# Patient Record
Sex: Female | Born: 1990 | Race: Black or African American | Hispanic: No | Marital: Single | Smoking: Never smoker
Health system: Southern US, Community
[De-identification: ages and names within clinical notes are randomized; demographics above are authoritative.]

## PROBLEM LIST (undated history)

## (undated) DIAGNOSIS — R519 Headache, unspecified: Secondary | ICD-10-CM

## (undated) DIAGNOSIS — R51 Headache: Secondary | ICD-10-CM

## (undated) HISTORY — PX: OTHER SURGICAL HISTORY: SHX169

## (undated) HISTORY — DX: Headache, unspecified: R51.9

## (undated) HISTORY — DX: Headache: R51

---
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
## 2013-01-10 ENCOUNTER — Emergency Department (HOSPITAL_COMMUNITY)
Admission: EM | Admit: 2013-01-10 | Discharge: 2013-01-10 | Disposition: A | Discharge status: Home / Self Care | Payer: 59 | Attending: Emergency Medicine | Admitting: Emergency Medicine

## 2013-01-10 DIAGNOSIS — R197 Diarrhea, unspecified: Secondary | ICD-10-CM | POA: Insufficient documentation

## 2013-01-10 DIAGNOSIS — Z3202 Encounter for pregnancy test, result negative: Secondary | ICD-10-CM | POA: Insufficient documentation

## 2013-01-10 DIAGNOSIS — R1110 Vomiting, unspecified: Secondary | ICD-10-CM | POA: Insufficient documentation

## 2013-01-10 LAB — URINALYSIS, ROUTINE W REFLEX MICROSCOPIC
Glucose, UA: NEGATIVE mg/dL
Hgb urine dipstick: NEGATIVE
Ketones, ur: 40 mg/dL — AB
Protein, ur: NEGATIVE mg/dL
Specific Gravity, Urine: 1.014 (ref 1.005–1.030)
Urobilinogen, UA: 1 mg/dL (ref 0.0–1.0)
pH: 7.5 (ref 5.0–8.0)

## 2013-01-10 LAB — URINE MICROSCOPIC-ADD ON

## 2013-01-10 LAB — BASIC METABOLIC PANEL
BUN: 9 mg/dL (ref 6–23)
Chloride: 104 mEq/L (ref 96–112)
GFR calc non Af Amer: >90 mL/min (ref >90)
Glucose, Bld: 84 mg/dL (ref 70–99)
Sodium: 140 mEq/L (ref 135–145)

## 2013-01-10 LAB — PREGNANCY, URINE: Preg Test, Ur: NEGATIVE

## 2013-01-10 MED ORDER — SODIUM CHLORIDE 0.9 % IV BOLUS (SEPSIS)
1000.0000 mL | Freq: Once | INTRAVENOUS | Status: AC
  Administered 2013-01-10: 1000 mL via INTRAVENOUS

## 2013-01-10 MED ORDER — ONDANSETRON HCL 4 MG/2ML IJ SOLN
4.0000 mg | Freq: Once | INTRAMUSCULAR | Status: AC
  Administered 2013-01-10: 4 mg via INTRAVENOUS
  Filled 2013-01-10: qty 2

## 2013-01-10 MED ORDER — ONDANSETRON 4 MG PO TBDP: 4.0000 mg | ORAL_TABLET | Freq: Three times a day (TID) | ORAL | Status: DC | PRN

## 2013-01-10 NOTE — ED Provider Notes (Signed)
History     CSN: [id number]  Arrival date & time 01/10/13  1159   First MD Initiated Contact with Patient 01/10/13 1220      Chief Complaint  Patient presents with  . Emesis    (Consider location/radiation/quality/duration/timing/severity/associated sxs/prior treatment) Patient is a 22 y.o. female presenting with vomiting. The history is provided by the patient.  Emesis Severity:  Moderate Duration:  1 day Timing:  Intermittent Quality:  Unable to specify Able to tolerate:  Liquids Progression:  Unchanged Chronicity:  New Recent urination:  Normal Relieved by:  Nothing Worsened by:  Nothing tried Ineffective treatments:  None tried Associated symptoms: diarrhea   Associated symptoms: no abdominal pain and no fever   Risk factors: sick contacts     No past medical history on file.  No past surgical history on file.  No family history on file.  History  Substance Use Topics  . Smoking status: Not on file  . Smokeless tobacco: Not on file  . Alcohol Use: Not on file    OB History   No data available      Review of Systems  Constitutional: Negative.   Respiratory: Negative.   Cardiovascular: Negative.   Gastrointestinal: Positive for vomiting and diarrhea. Negative for abdominal pain.    Allergies  Review of patient's allergies indicates no known allergies.  Home Medications  No current outpatient prescriptions on file.  BP 117/64  Pulse 86  Temp(Src) 98.4 F (36.9 C) (Oral)  Resp 14  Ht 5\' 2"  (1.575 m)  Wt 107 lb (48.535 kg)  BMI 19.57 kg/m2  SpO2 99%  LMP 12/30/2012  Physical Exam  Nursing note and vitals reviewed. Constitutional: She is oriented to person, place, and time. She appears well-developed and well-nourished.  HENT:  Head: Normocephalic and atraumatic.  Cardiovascular: Normal rate and regular rhythm.   Pulmonary/Chest: Effort normal and breath sounds normal.  Abdominal: Soft. Bowel sounds are normal. There is no tenderness.   Musculoskeletal: Normal range of motion.  Neurological: She is alert and oriented to person, place, and time.  Skin: Skin is warm and dry.  Psychiatric: She has a normal mood and affect.    ED Course  Procedures (including critical care time)  Labs Reviewed  URINALYSIS, ROUTINE W REFLEX MICROSCOPIC - Abnormal; Notable for the following:    APPearance CLOUDY (*)    Ketones, ur 40 (*)    Leukocytes, UA TRACE (*)    All other components within normal limits  URINE MICROSCOPIC-ADD ON - Abnormal; Notable for the following:    Squamous Epithelial / LPF MANY (*)    Bacteria, UA FEW (*)    All other components within normal limits  URINE CULTURE  PREGNANCY, URINE  BASIC METABOLIC PANEL   No results found.   1. Vomiting       MDM  Pt is tolerating po without any problem:likely viral:urine sent for culture:doubt uti        Teressa Lower, NP 01/10/13 1450

## 2013-01-10 NOTE — ED Notes (Signed)
She c/o n/v/d since yesterday.  She states she has had ~6 episodes of emesis and 2 diarrhea stools since yesterday, and she c/o persistent nausea. She denies fever/cough; and is in no distress.

## 2013-01-10 NOTE — ED Provider Notes (Signed)
Medical screening examination/treatment/procedure(s) were performed by non-physician practitioner and as supervising physician I was immediately available for consultation/collaboration.   Richardean Canal, MD 01/10/13 [phone]

## 2013-01-12 LAB — URINE CULTURE: Colony Count: >=100000

## 2013-01-13 NOTE — ED Notes (Signed)
+   Urine Chart sent to EDP office for review. 

## 2013-01-15 NOTE — ED Notes (Addendum)
Chart returned from EDP office - No treatment. Per Paula Welch

## 2013-07-07 ENCOUNTER — Ambulatory Visit (INDEPENDENT_AMBULATORY_CARE_PROVIDER_SITE_OTHER): Payer: 59 | Admitting: Physician Assistant

## 2013-07-07 VITALS — BP 112/60 | HR 91 | Temp 98.2°F | Resp 18 | Ht 61.0 in | Wt 106.0 lb

## 2013-07-07 DIAGNOSIS — R319 Hematuria, unspecified: Secondary | ICD-10-CM

## 2013-07-07 DIAGNOSIS — R350 Frequency of micturition: Secondary | ICD-10-CM

## 2013-07-07 DIAGNOSIS — J069 Acute upper respiratory infection, unspecified: Secondary | ICD-10-CM

## 2013-07-07 LAB — POCT URINALYSIS DIPSTICK
Bilirubin, UA: NEGATIVE
Glucose, UA: NEGATIVE
Ketones, UA: 15
Nitrite, UA: NEGATIVE
Spec Grav, UA: >=1.03
Urobilinogen, UA: 2
pH, UA: 6.5

## 2013-07-07 LAB — POCT UA - MICROSCOPIC ONLY
Casts, Ur, LPF, POC: NEGATIVE
Crystals, Ur, HPF, POC: NEGATIVE
Yeast, UA: NEGATIVE

## 2013-07-07 MED ORDER — CIPROFLOXACIN 500 MG/5ML (10%) PO SUSR: 500.0000 mg | Freq: Two times a day (BID) | ORAL | Status: DC

## 2013-07-07 NOTE — Progress Notes (Signed)
Subjective:    Patient ID: Paula Welch, female    DOB: 05/04/1991, 22 y.o.   MRN: [id number]  HPI 22 year old female presents with 4 day history of hematuria.  States symptoms started after she ended her menses and have persisted. She has noticed a small amount of blood when she wipes after urination.  Admits to some urinary frequency but no dysuria, vaginal discharge, odor, nausea, vomiting, abdominal pain, fever, or chills.  No hx of UTI's in the past.  Also complains of 2 days of cough and nasal congestion. Has been taking Mucinex and alka-seltzer which does seem to be helping some.  No sore throat, otalgia, headache, sinus pain, SOB, wheezing, or hemoptysis.  She is healthy with no known medical problems.  Patient is otherwise doing well with no other concerns today.     Review of Systems  Constitutional: Negative for fever and chills.  HENT: Positive for congestion, rhinorrhea and postnasal drip. Negative for ear pain, sore throat and sinus pressure.   Gastrointestinal: Negative for nausea, vomiting and abdominal pain.  Genitourinary: Positive for frequency and hematuria. Negative for dysuria, vaginal discharge, menstrual problem and pelvic pain.  Neurological: Negative for dizziness and headaches.       Objective:   Physical Exam  Constitutional: She is oriented to person, place, and time. She appears well-developed and well-nourished.  HENT:  Head: Normocephalic and atraumatic.  Right Ear: Hearing, tympanic membrane, external ear and ear canal normal.  Left Ear: Hearing, tympanic membrane, external ear and ear canal normal.  Mouth/Throat: Uvula is midline, oropharynx is clear and moist and mucous membranes are normal.  Eyes: Conjunctivae are normal.  Neck: Normal range of motion. Neck supple.  Cardiovascular: Normal rate, regular rhythm and normal heart sounds.   Pulmonary/Chest: Effort normal and breath sounds normal.  Abdominal: Soft. Bowel sounds are normal. There is no  tenderness. There is no rebound, no guarding and no CVA tenderness.  Lymphadenopathy:    She has no cervical adenopathy.  Neurological: She is alert and oriented to person, place, and time.  Psychiatric: She has a normal mood and affect. Her behavior is normal. Judgment and thought content normal.    Results for orders placed in visit on 07/07/13  POCT URINALYSIS DIPSTICK      Result Value Range   Color, UA yellow     Clarity, UA clear     Glucose, UA neg     Bilirubin, UA neg     Ketones, UA 15     Spec Grav, UA >=1.030     Blood, UA trace-intact     pH, UA 6.5     Protein, UA trace     Urobilinogen, UA 2.0     Nitrite, UA neg     Leukocytes, UA small (1+)    POCT UA - MICROSCOPIC ONLY      Result Value Range   WBC, Ur, HPF, POC 4-16     RBC, urine, microscopic 3-12     Bacteria, U Microscopic trace     Mucus, UA mod     Epithelial cells, urine per micros 0-3     Crystals, Ur, HPF, POC neg     Casts, Ur, LPF, POC neg     Yeast, UA neg           Assessment & Plan:  Blood in the urine - Plan: POCT urinalysis dipstick, POCT UA - Microscopic Only, ciprofloxacin (CIPRO) 500 MG/5ML (10%) suspension, Urine culture  Urinary frequency  URI (upper respiratory infection)  Urine culture sent Start Cipro 500 mg bid x 5 days Increase fluids and rest URI - likely viral. Continue mucinex and alka-seltzer x 2-3 days. If no improvement may consider antibiotic.  Follow up if symptoms worsen or fail to improve.

## 2013-07-09 LAB — URINE CULTURE
Colony Count: NO GROWTH
Organism ID, Bacteria: NO GROWTH

## 2016-06-27 ENCOUNTER — Ambulatory Visit (INDEPENDENT_AMBULATORY_CARE_PROVIDER_SITE_OTHER): Payer: 59 | Admitting: Neurology

## 2016-06-27 ENCOUNTER — Encounter: Payer: Self-pay | Admitting: Neurology

## 2016-06-27 VITALS — BP 110/68 | HR 62 | Resp 20 | Ht 62.0 in | Wt 122.0 lb

## 2016-06-27 DIAGNOSIS — R519 Headache, unspecified: Secondary | ICD-10-CM

## 2016-06-27 DIAGNOSIS — R110 Nausea: Secondary | ICD-10-CM | POA: Diagnosis not present

## 2016-06-27 DIAGNOSIS — G43709 Chronic migraine without aura, not intractable, without status migrainosus: Secondary | ICD-10-CM | POA: Diagnosis not present

## 2016-06-27 DIAGNOSIS — Z8249 Family history of ischemic heart disease and other diseases of the circulatory system: Secondary | ICD-10-CM | POA: Diagnosis not present

## 2016-06-27 DIAGNOSIS — R51 Headache: Secondary | ICD-10-CM

## 2016-06-27 DIAGNOSIS — H93A9 Pulsatile tinnitus, unspecified ear: Secondary | ICD-10-CM | POA: Diagnosis not present

## 2016-06-27 MED ORDER — SUMATRIPTAN SUCCINATE 100 MG PO TABS: 100.0000 mg | ORAL_TABLET | Freq: Once | ORAL | 12 refills | Status: DC | PRN

## 2016-06-27 MED ORDER — NORTRIPTYLINE HCL 10 MG PO CAPS: 10.0000 mg | ORAL_CAPSULE | Freq: Every day | ORAL | 12 refills | Status: DC

## 2016-06-27 MED ORDER — ONDANSETRON 4 MG PO TBDP: 4.0000 mg | ORAL_TABLET | Freq: Three times a day (TID) | ORAL | 12 refills | Status: DC | PRN

## 2016-06-27 MED ORDER — TOPIRAMATE 25 MG PO TABS: 50.0000 mg | ORAL_TABLET | Freq: Every day | ORAL | 12 refills | Status: DC

## 2016-06-27 NOTE — Progress Notes (Signed)
[id number]    Provider:  Dr Lucia GaskinsAhern Referring Provider: Jonna ClarkKrishnamani Chinnasamy, MD Primary Care Physician:  Jonna ClarkKrishnamani Chinnasamy, MD  CC:  headaches  HPI:  [name] is a 25 y.o. female here as a referral from Dr. Yetta BarreJones for headaches. Patient is here with mother who provides most information. There is a family history of migraines in grandmother and aunt. Throbbing on the right side, going into a fark room helps, smells bother her, nausea and vomiting but not often. She is having them every other day. They can last a few hours. She has to go to sleep to resolve them. No known triggers. No inciting events or head trauma.  History aneurysms in mother, 2 aunts and grandmother and paternal grandmother. No vision changes or hearing changes. She takes excedrin migraine, tylenol, goody powders. Starts light and gradually gets worse can be severe. No aura. Always on the right and can be 8/10. Ringing in the ears, pulsating ringing in the ears. No other focal neurologic deficits or complaints  Reviewed notes, labs and imaging from outside physicians, which showed:  CT 5/20017 HEAD CT WITHOUT CONTRAST:  Technique: Contiguous axial images were obtained from the base of the skull through the vertex according to standard protocol without contrast.  Comparison: None.   Findings: No evidence of acute infarct, hemorrhage, mass, mass effect, hydrocephalus, or fracture. Visualized paranasal sinuses and mastoid air cells are clear.   IMPRESSION:  No acute findings.  BMP normal with creatinine 0.6  Review of Systems: Patient complains of symptoms per HPI as well as the following symptoms: no CP, no SOB. Pertinent negatives per HPI. All others negative.   Social History   Social History  . Marital status: Single    Spouse name: N/A  . Number of children: N/A  . Years of education: N/A   Occupational History  . student    Social History Main Topics  . Smoking status:  Never Smoker  . Smokeless tobacco: Never Used  . Alcohol use 1.8 oz/week    3 Glasses of wine per week  . Drug use: No  . Sexual activity: Not on file   Other Topics Concern  . Not on file   Social History Narrative   Drinks rare caffeine.    Family History  Problem Relation Age of Onset  . Aneurysm Mother   . Breast cancer Mother     Past Medical History:  Diagnosis Date  . Headache     Past Surgical History:  Procedure Laterality Date  . no surgical history      Current Outpatient Prescriptions  Medication Sig Dispense Refill  . acetaminophen (TYLENOL) 500 MG tablet Take 500 mg by mouth every 6 (six) hours as needed.    Marland Kitchen. aspirin-acetaminophen-caffeine (EXCEDRIN MIGRAINE) 250-250-65 MG tablet Take by mouth every 6 (six) hours as needed for headache.    . ondansetron (ZOFRAN ODT) 4 MG disintegrating tablet Take 1 tablet (4 mg total) by mouth every 8 (eight) hours as needed for nausea or vomiting. 20 tablet 12  . SUMAtriptan (IMITREX) 100 MG tablet Take 1 tablet (100 mg total) by mouth once as needed for migraine. May repeat in 2 hours if headache persists or recurs. 10 tablet 12  . topiramate (TOPAMAX) 25 MG tablet Take 2 tablets (50 mg total) by mouth at bedtime. 60 tablet 12   No current facility-administered medications for this visit.     Allergies as of 06/27/2016  . (No Known Allergies)  Vitals: BP 110/68   Pulse 62   Resp 20   Ht 5\' 2"  (1.575 m)   Wt 122 lb (55.3 kg)   BMI 22.31 kg/m  Last Weight:  Wt Readings from Last 1 Encounters:  06/27/16 122 lb (55.3 kg)   Last Height:   Ht Readings from Last 1 Encounters:  06/27/16 5\' 2"  (1.575 m)    Physical exam: Exam: Gen: NAD, conversant, well nourised, well groomed                     CV: RRR, no MRG. No Carotid Bruits. No peripheral edema, warm, nontender Eyes: Conjunctivae clear without exudates or hemorrhage  Neuro: Detailed Neurologic Exam  Speech:    Speech is normal; fluent and  spontaneous with normal comprehension.  Cognition:    The patient is oriented to person, place, and time;     recent and remote memory intact;     language fluent;     normal attention, concentration,     fund of knowledge Cranial Nerves:    The pupils are equal, round, and reactive to light. The fundi are normal and spontaneous venous pulsations are present. Visual fields are full to finger confrontation. Extraocular movements are intact. Trigeminal sensation is intact and the muscles of mastication are normal. The face is symmetric. The palate elevates in the midline. Hearing intact. Voice is normal. Shoulder shrug is normal. The tongue has normal motion without fasciculations.   Coordination:    Normal finger to nose and heel to shin. Normal rapid alternating movements.   Gait:    Heel-toe and tandem gait are normal.   Motor Observation:    No asymmetry, no atrophy, and no involuntary movements noted. Tone:    Normal muscle tone.    Posture:    Posture is normal. normal erect    Strength:    Strength is V/V in the upper and lower limbs.      Sensation: intact to LT     Reflex Exam:  DTR's:    Deep tendon reflexes in the upper and lower extremities are normal bilaterally.   Toes:    The toes are downgoing bilaterally.   Clonus:    Clonus is absent.      Assessment/Plan:  25 year old with chronic migraines  Fhx of aneurysms and pulsatile tinnitus: MRA of the head  Remember to drink plenty of fluid, eat healthy meals and do not skip any meals. Try to eat protein with a every meal and eat a healthy snack such as fruit or nuts in between meals. Try to keep a regular sleep-wake schedule and try to exercise daily, particularly in the form of walking, 20-30 minutes a day, if you can.   As far as your medications are concerned, I would like to suggest Topiramate: start with 25mg  at night for one week then increase to 50mg  at bedtime Imitrex: Please take one tablet at the  onset of your headache. If it does not improve the symptoms please take one additional tablet in 2 hours. Do not take more then 2 tablets in 24hrs. Do not take use more then 2 to 3 days in a week. Zofran as needed for nausea  As far as diagnostic testing: MRA of the head, labs  I would like to see you back in 3-4 month, sooner if we need to. Please call us with any interim questions, concerns, problems, updates or refill requests.   Discussed side effects as per  patient instructions, do not get pregnant whole on topamax - teratogenic   Discussed: To prevent or relieve headaches, try the following: Cool Compress. Lie down and place a cool compress on your head.  Avoid headache triggers. If certain foods or odors seem to have triggered your migraines in the past, avoid them. A headache diary might help you identify triggers.  Include physical activity in your daily routine. Try a daily walk or other moderate aerobic exercise.  Manage stress. Find healthy ways to cope with the stressors, such as delegating tasks on your to-do list.  Practice relaxation techniques. Try deep breathing, yoga, massage and visualization.  Eat regularly. Eating regularly scheduled meals and maintaining a healthy diet might help prevent headaches. Also, drink plenty of fluids.  Follow a regular sleep schedule. Sleep deprivation might contribute to headaches Consider biofeedback. With this mind-body technique, you learn to control certain bodily functions - such as muscle tension, heart rate and blood pressure - to prevent headaches or reduce headache pain.    Proceed to emergency room if you experience new or worsening symptoms or symptoms do not resolve, if you have new neurologic symptoms or if headache is severe, or for any concerning symptom.   Cc: Jonna Clark, MD   Naomie Dean, MD  [facility] [address] [place], [place] [address]  Phone [phone] Fax  [phone]

## 2016-06-27 NOTE — Patient Instructions (Addendum)
Remember to drink plenty of fluid, eat healthy meals and do not skip any meals. Try to eat protein with a every meal and eat a healthy snack such as fruit or nuts in between meals. Try to keep a regular sleep-wake schedule and try to exercise daily, particularly in the form of walking, 20-30 minutes a day, if you can.   As far as your medications are concerned, I would like to suggest Topiramate: start with 25mg  at night for one week then increase to 50mg  at bedtime Imitrex: Please take one tablet at the onset of your headache. If it does not improve the symptoms please take one additional tablet in 2 hours. Do not take more then 2 tablets in 24hrs. Do not take use more then 2 to 3 days in a week. Zofran as needed for nausea  As far as diagnostic testing: MRA of the head, labs  I would like to see you back in 3-4 month, sooner if we need to. Please call us with any interim questions, concerns, problems, updates or refill requests.    Our phone number is [phone]. We also have an after hours call service for urgent matters and there is a physician on-call for urgent questions. For any emergencies you know to call 911 or go to the nearest emergency room  Topiramate tablets What is this medicine? TOPIRAMATE (toe PYRE a mate) is used to treat seizures in adults or children with epilepsy. It is also used for the prevention of migraine headaches. This medicine may be used for other purposes; ask your health care provider or pharmacist if you have questions. What should I tell my health care provider before I take this medicine? They need to know if you have any of these conditions: -bleeding disorders -cirrhosis of the liver or liver disease -diarrhea -glaucoma -kidney stones or kidney disease -low blood counts, like low white cell, platelet, or red cell counts -lung disease like asthma, obstructive pulmonary disease, emphysema -metabolic acidosis -on a ketogenic diet -schedule for surgery  or a procedure -suicidal thoughts, plans, or attempt; a previous suicide attempt by you or a family member -an unusual or allergic reaction to topiramate, other medicines, foods, dyes, or preservatives -pregnant or trying to get pregnant -breast-feeding How should I use this medicine? Take this medicine by mouth with a glass of water. Follow the directions on the prescription label. Do not crush or chew. You may take this medicine with meals. Take your medicine at regular intervals. Do not take it more often than directed. Talk to your pediatrician regarding the use of this medicine in children. Special care may be needed. While this drug may be prescribed for children as young as 552 years of age for selected conditions, precautions do apply. Overdosage: If you think you have taken too much of this medicine contact a poison control center or emergency room at once. NOTE: This medicine is only for you. Do not share this medicine with others. What if I miss a dose? If you miss a dose, take it as soon as you can. If your next dose is to be taken in less than 6 hours, then do not take the missed dose. Take the next dose at your regular time. Do not take double or extra doses. What may interact with this medicine? Do not take this medicine with any of the following medications: -probenecid This medicine may also interact with the following medications: -acetazolamide -alcohol -amitriptyline -aspirin and aspirin-like medicines -birth control pills -certain  medicines for depression -certain medicines for seizures -certain medicines that treat or prevent blood clots like warfarin, enoxaparin, dalteparin, apixaban, dabigatran, and rivaroxaban -digoxin -hydrochlorothiazide -lithium -medicines for pain, sleep, or muscle relaxation -metformin -methazolamide -NSAIDS, medicines for pain and inflammation, like ibuprofen or naproxen -pioglitazone -risperidone This list may not describe all possible  interactions. Give your health care provider a list of all the medicines, herbs, non-prescription drugs, or dietary supplements you use. Also tell them if you smoke, drink alcohol, or use illegal drugs. Some items may interact with your medicine. What should I watch for while using this medicine? Visit your doctor or health care professional for regular checks on your progress. Do not stop taking this medicine suddenly. This increases the risk of seizures if you are using this medicine to control epilepsy. Wear a medical identification bracelet or chain to say you have epilepsy or seizures, and carry a card that lists all your medicines. This medicine can decrease sweating and increase your body temperature. Watch for signs of deceased sweating or fever, especially in children. Avoid extreme heat, hot baths, and saunas. Be careful about exercising, especially in hot weather. Contact your health care provider right away if you notice a fever or decrease in sweating. You should drink plenty of fluids while taking this medicine. If you have had kidney stones in the past, this will help to reduce your chances of forming kidney stones. If you have stomach pain, with nausea or vomiting and yellowing of your eyes or skin, call your doctor immediately. You may get drowsy, dizzy, or have blurred vision. Do not drive, use machinery, or do anything that needs mental alertness until you know how this medicine affects you. To reduce dizziness, do not sit or stand up quickly, especially if you are an older patient. Alcohol can increase drowsiness and dizziness. Avoid alcoholic drinks. If you notice blurred vision, eye pain, or other eye problems, seek medical attention at once for an eye exam. The use of this medicine may increase the chance of suicidal thoughts or actions. Pay special attention to how you are responding while on this medicine. Any worsening of mood, or thoughts of suicide or dying should be reported to  your health care professional right away. This medicine may increase the chance of developing metabolic acidosis. If left untreated, this can cause kidney stones, bone disease, or slowed growth in children. Symptoms include breathing fast, fatigue, loss of appetite, irregular heartbeat, or loss of consciousness. Call your doctor immediately if you experience any of these side effects. Also, tell your doctor about any surgery you plan on having while taking this medicine since this may increase your risk for metabolic acidosis. Birth control pills may not work properly while you are taking this medicine. Talk to your doctor about using an extra method of birth control. Women who become pregnant while using this medicine may enroll in the [place] [employer] Antiepileptic Drug Pregnancy Registry by calling [phone]. This registry collects information about the safety of antiepileptic drug use during pregnancy. What side effects may I notice from receiving this medicine? Side effects that you should report to your doctor or health care professional as soon as possible: -allergic reactions like skin rash, itching or hives, swelling of the face, lips, or tongue -decreased sweating and/or rise in body temperature -depression -difficulty breathing, fast or irregular breathing patterns -difficulty speaking -difficulty walking or controlling muscle movements -hearing impairment -redness, blistering, peeling or loosening of the skin, including inside the mouth -tingling, pain  or numbness in the hands or feet -unusual bleeding or bruising -unusually weak or tired -worsening of mood, thoughts or actions of suicide or dying Side effects that usually do not require medical attention (report to your doctor or health care professional if they continue or are bothersome): -altered taste -back pain, joint or muscle aches and pains -diarrhea, or constipation -headache -loss of appetite -nausea -stomach upset,  indigestion -tremors This list may not describe all possible side effects. Call your doctor for medical advice about side effects. You may report side effects to FDA at 1-800-FDA-1088. Where should I keep my medicine? Keep out of the reach of children. Store at room temperature between 15 and 30 degrees C (59 and 86 degrees F) in a tightly closed container. Protect from moisture. Throw away any unused medicine after the expiration date. NOTE: This sheet is a summary. It may not cover all possible information. If you have questions about this medicine, talk to your doctor, pharmacist, or health care provider.    2016, [employer]. (2013-11-02 23:17:57)  Sumatriptan tablets What is this medicine? SUMATRIPTAN (soo ma TRIP tan) is used to treat migraines with or without aura. An aura is a strange feeling or visual disturbance that warns you of an attack. It is not used to prevent migraines. This medicine may be used for other purposes; ask your health care provider or pharmacist if you have questions. What should I tell my health care provider before I take this medicine? They need to know if you have any of these conditions: -circulation problems in fingers and toes -diabetes -heart disease -high blood pressure -high cholesterol -history of irregular heartbeat -history of stroke -kidney disease -liver disease -postmenopausal or surgical removal of uterus and ovaries -seizures -smoke tobacco -stomach or intestine problems -an unusual or allergic reaction to sumatriptan, other medicines, foods, dyes, or preservatives -pregnant or trying to get pregnant -breast-feeding How should I use this medicine? Take this medicine by mouth with a glass of water. Follow the directions on the prescription label. This medicine is taken at the first symptoms of a migraine. It is not for everyday use. If your migraine headache returns after one dose, you can take another dose as directed. You  must leave at least 2 hours between doses, and do not take more than 100 mg as a single dose. Do not take more than 200 mg total in any 24 hour period. If there is no improvement at all after the first dose, do not take a second dose without talking to your doctor or health care professional. Do not take your medicine more often than directed. Talk to your pediatrician regarding the use of this medicine in children. Special care may be needed. Overdosage: If you think you have taken too much of this medicine contact a poison control center or emergency room at once. NOTE: This medicine is only for you. Do not share this medicine with others. What if I miss a dose? This does not apply; this medicine is not for regular use. What may interact with this medicine? Do not take this medicine with any of the following medicines: -cocaine -ergot alkaloids like dihydroergotamine, ergonovine, ergotamine, methylergonovine -feverfew -MAOIs like Carbex, Eldepryl, Marplan, Nardil, and Parnate -other medicines for migraine headache like almotriptan, eletriptan, frovatriptan, naratriptan, rizatriptan, zolmitriptan -tryptophan This medicine may also interact with the following medications: -certain medicines for depression, anxiety, or psychotic disturbances This list may not describe all possible interactions. Give your health care provider a list  of all the medicines, herbs, non-prescription drugs, or dietary supplements you use. Also tell them if you smoke, drink alcohol, or use illegal drugs. Some items may interact with your medicine. What should I watch for while using this medicine? Only take this medicine for a migraine headache. Take it if you get warning symptoms or at the start of a migraine attack. It is not for regular use to prevent migraine attacks. You may get drowsy or dizzy. Do not drive, use machinery, or do anything that needs mental alertness until you know how this medicine affects you. To  reduce dizzy or fainting spells, do not sit or stand up quickly, especially if you are an older patient. Alcohol can increase drowsiness, dizziness and flushing. Avoid alcoholic drinks. Smoking cigarettes may increase the risk of heart-related side effects from using this medicine. If you take migraine medicines for 10 or more days a month, your migraines may get worse. Keep a diary of headache days and medicine use. Contact your healthcare professional if your migraine attacks occur more frequently. What side effects may I notice from receiving this medicine? Side effects that you should report to your doctor or health care professional as soon as possible: -allergic reactions like skin rash, itching or hives, swelling of the face, lips, or tongue -bloody or watery diarrhea -hallucination, loss of contact with reality -pain, tingling, numbness in the face, hands, or feet -seizures -signs and symptoms of a blood clot such as breathing problems; changes in vision; chest pain; severe, sudden headache; pain, swelling, warmth in the leg; trouble speaking; sudden numbness or weakness of the face, arm, or leg -signs and symptoms of a dangerous change in heartbeat or heart rhythm like chest pain; dizziness; fast or irregular heartbeat; palpitations, feeling faint or lightheaded; falls; breathing problems -signs and symptoms of a stroke like changes in vision; confusion; trouble speaking or understanding; severe headaches; sudden numbness or weakness of the face, arm, or leg; trouble walking; dizziness; loss of balance or coordination -stomach pain Side effects that usually do not require medical attention (report these to your doctor or health care professional if they continue or are bothersome): -changes in taste -facial flushing -headache -muscle cramps -muscle pain -nausea, vomiting -weak or tired This list may not describe all possible side effects. Call your doctor for medical advice about side  effects. You may report side effects to FDA at 1-800-FDA-1088. Where should I keep my medicine? Keep out of the reach of children. Store at room temperature between 2 and 30 degrees C (36 and 86 degrees F). Throw away any unused medicine after the expiration date. NOTE: This sheet is a summary. It may not cover all possible information. If you have questions about this medicine, talk to your doctor, pharmacist, or health care provider.    2016, [employer] Standard. (2015-05-05 17:46:40)  Ondansetron oral dissolving tablet What is this medicine? ONDANSETRON (on DAN se tron) is used to treat nausea and vomiting caused by chemotherapy. It is also used to prevent or treat nausea and vomiting after surgery. This medicine may be used for other purposes; ask your health care provider or pharmacist if you have questions. What should I tell my health care provider before I take this medicine? They need to know if you have any of these conditions: -heart disease -history of irregular heartbeat -liver disease -low levels of magnesium or potassium in the blood -an unusual or allergic reaction to ondansetron, granisetron, other medicines, foods, dyes, or preservatives -pregnant or trying  to get pregnant -breast-feeding How should I use this medicine? These tablets are made to dissolve in the mouth. Do not try to push the tablet through the foil backing. With dry hands, peel away the foil backing and gently remove the tablet. Place the tablet in the mouth and allow it to dissolve, then swallow. While you may take these tablets with water, it is not necessary to do so. Talk to your pediatrician regarding the use of this medicine in children. Special care may be needed. Overdosage: If you think you have taken too much of this medicine contact a poison control center or emergency room at once. NOTE: This medicine is only for you. Do not share this medicine with others. What if I miss a dose? If you  miss a dose, take it as soon as you can. If it is almost time for your next dose, take only that dose. Do not take double or extra doses. What may interact with this medicine? Do not take this medicine with any of the following medications: -apomorphine -certain medicines for fungal infections like fluconazole, itraconazole, ketoconazole, posaconazole, voriconazole -cisapride -dofetilide -dronedarone -pimozide -thioridazine -ziprasidone This medicine may also interact with the following medications: -carbamazepine -certain medicines for depression, anxiety, or psychotic disturbances -fentanyl -linezolid -MAOIs like Carbex, Eldepryl, Marplan, Nardil, and Parnate -methylene blue (injected into a vein) -other medicines that prolong the QT interval (cause an abnormal heart rhythm) -phenytoin -rifampicin -tramadol This list may not describe all possible interactions. Give your health care provider a list of all the medicines, herbs, non-prescription drugs, or dietary supplements you use. Also tell them if you smoke, drink alcohol, or use illegal drugs. Some items may interact with your medicine. What should I watch for while using this medicine? Check with your doctor or health care professional as soon as you can if you have any sign of an allergic reaction. What side effects may I notice from receiving this medicine? Side effects that you should report to your doctor or health care professional as soon as possible: -allergic reactions like skin rash, itching or hives, swelling of the face, lips, or tongue -breathing problems -confusion -dizziness -fast or irregular heartbeat -feeling faint or lightheaded, falls -fever and chills -loss of balance or coordination -seizures -sweating -swelling of the hands and feet -tightness in the chest -tremors -unusually weak or tired Side effects that usually do not require medical attention (report to your doctor or health care professional  if they continue or are bothersome): -constipation or diarrhea -headache This list may not describe all possible side effects. Call your doctor for medical advice about side effects. You may report side effects to FDA at 1-800-FDA-1088. Where should I keep my medicine? Keep out of the reach of children. Store between 2 and 30 degrees C (36 and 86 degrees F). Throw away any unused medicine after the expiration date. NOTE: This sheet is a summary. It may not cover all possible information. If you have questions about this medicine, talk to your doctor, pharmacist, or health care provider.    2016, [employer]. (2013-08-05 16:21:52)

## 2016-06-30 ENCOUNTER — Encounter: Payer: Self-pay | Admitting: Neurology

## 2016-07-25 ENCOUNTER — Other Ambulatory Visit: Payer: 59

## 2016-10-29 ENCOUNTER — Ambulatory Visit: Payer: 59 | Admitting: Neurology

## 2016-10-29 ENCOUNTER — Telehealth: Payer: Self-pay | Admitting: *Deleted

## 2016-10-29 NOTE — Telephone Encounter (Signed)
no showed f/u 

## 2016-10-31 ENCOUNTER — Encounter: Payer: Self-pay | Admitting: Neurology

## 2017-08-03 ENCOUNTER — Other Ambulatory Visit: Payer: Self-pay | Admitting: Neurology

## 2017-08-03 DIAGNOSIS — G43709 Chronic migraine without aura, not intractable, without status migrainosus: Secondary | ICD-10-CM

## 2017-09-16 ENCOUNTER — Telehealth: Payer: Self-pay | Admitting: Neurology

## 2017-11-13 ENCOUNTER — Other Ambulatory Visit: Payer: Self-pay | Admitting: Neurology

## 2017-11-13 ENCOUNTER — Telehealth: Payer: Self-pay | Admitting: Neurology

## 2017-11-13 DIAGNOSIS — G43709 Chronic migraine without aura, not intractable, without status migrainosus: Secondary | ICD-10-CM

## 2017-11-13 NOTE — Telephone Encounter (Signed)
Refill for her imitrex was sent to the pharmacy.

## 2017-11-13 NOTE — Telephone Encounter (Signed)
Pt has an appointment scheduled for tomorrow @ 8:00 she is wanting to know if something can be called in for her to hold her over until tomorrow's appointment

## 2017-11-14 ENCOUNTER — Telehealth: Payer: Self-pay | Admitting: *Deleted

## 2017-11-14 ENCOUNTER — Encounter: Payer: Self-pay | Admitting: Neurology

## 2017-11-14 ENCOUNTER — Ambulatory Visit: Payer: 59 | Admitting: Neurology

## 2017-11-14 ENCOUNTER — Encounter (INDEPENDENT_AMBULATORY_CARE_PROVIDER_SITE_OTHER): Payer: Self-pay

## 2017-11-14 VITALS — BP 116/74 | HR 87 | Ht 61.0 in | Wt 135.0 lb

## 2017-11-14 DIAGNOSIS — G43709 Chronic migraine without aura, not intractable, without status migrainosus: Secondary | ICD-10-CM | POA: Diagnosis not present

## 2017-11-14 MED ORDER — TOPIRAMATE ER 100 MG PO SPRINKLE CAP24: 100.0000 mg | EXTENDED_RELEASE_CAPSULE | Freq: Every day | ORAL | 11 refills | Status: DC

## 2017-11-14 NOTE — Patient Instructions (Signed)
Qudexy: 25mg  x 1 week, 50mg  x 1 week then 100mg   Topiramate extended-release capsules What is this medicine? TOPIRAMATE (toe PYRE a mate) is used to treat seizures in adults or children with epilepsy. It is also used for the prevention of migraine headaches. This medicine may be used for other purposes; ask your health care provider or pharmacist if you have questions. COMMON BRAND NAME(S): Trokendi XR What should I tell my health care provider before I take this medicine? They need to know if you have any of these conditions: -cirrhosis of the liver or liver disease -diarrhea -glaucoma -kidney stones or kidney disease -lung disease like asthma, obstructive pulmonary disease, emphysema -metabolic acidosis -on a ketogenic diet -scheduled for surgery or a procedure -suicidal thoughts, plans, or attempt; a previous suicide attempt by you or a family member -an unusual or allergic reaction to topiramate, other medicines, foods, dyes, or preservatives -pregnant or trying to get pregnant -breast-feeding How should I use this medicine? Take this medicine by mouth with a glass of water. Follow the directions on the prescription label. Trokendi XR capsules must be swallowed whole. Do not sprinkle on food, break, crush, dissolve, or chew. Qudexy XR capsules may be swallowed whole or opened and sprinkled on a small amount of soft food. This mixture must be swallowed immediately. Do not chew or store mixture for later use. You may take this medicine with meals. Take your medicine at regular intervals. Do not take it more often than directed. Talk to your pediatrician regarding the use of this medicine in children. Special care may be needed. While Trokendi XR may be prescribed for children as young as 6 years and Qudexy XR may be prescribed for children as young as 2 years for selected conditions, precautions do apply. Overdosage: If you think you have taken too much of this medicine contact a poison  control center or emergency room at once. NOTE: This medicine is only for you. Do not share this medicine with others. What if I miss a dose? If you miss a dose, take it as soon as you can. If it is almost time for your next dose, take only that dose. Do not take double or extra doses. What may interact with this medicine? Do not take this medicine with any of the following medications: -probenecid This medicine may also interact with the following medications: -acetazolamide -alcohol -amitriptyline -birth control pills -digoxin -hydrochlorothiazide -lithium -medicines for pain, sleep, or muscle relaxation -metformin -methazolamide -other seizure or epilepsy medicines -pioglitazone -risperidone This list may not describe all possible interactions. Give your health care provider a list of all the medicines, herbs, non-prescription drugs, or dietary supplements you use. Also tell them if you smoke, drink alcohol, or use illegal drugs. Some items may interact with your medicine. What should I watch for while using this medicine? Visit your doctor or health care professional for regular checks on your progress. Do not stop taking this medicine suddenly. This increases the risk of seizures if you are using this medicine to control epilepsy. Wear a medical identification bracelet or chain to say you have epilepsy or seizures, and carry a card that lists all your medicines. This medicine can decrease sweating and increase your body temperature. Watch for signs of deceased sweating or fever, especially in children. Avoid extreme heat, hot baths, and saunas. Be careful about exercising, especially in hot weather. Contact your health care provider right away if you notice a fever or decrease in sweating. You should drink  plenty of fluids while taking this medicine. If you have had kidney stones in the past, this will help to reduce your chances of forming kidney stones. If you have stomach pain, with  nausea or vomiting and yellowing of your eyes or skin, call your doctor immediately. You may get drowsy, dizzy, or have blurred vision. Do not drive, use machinery, or do anything that needs mental alertness until you know how this medicine affects you. To reduce dizziness, do not sit or stand up quickly, especially if you are an older patient. Alcohol can increase drowsiness and dizziness. Avoid alcoholic drinks. Do not drink alcohol for 6 hours before or 6 hours after taking Trokendi XR. If you notice blurred vision, eye pain, or other eye problems, seek medical attention at once for an eye exam. The use of this medicine may increase the chance of suicidal thoughts or actions. Pay special attention to how you are responding while on this medicine. Any worsening of mood, or thoughts of suicide or dying should be reported to your health care professional right away. This medicine may increase the chance of developing metabolic acidosis. If left untreated, this can cause kidney stones, bone disease, or slowed growth in children. Symptoms include breathing fast, fatigue, loss of appetite, irregular heartbeat, or loss of consciousness. Call your doctor immediately if you experience any of these side effects. Also, tell your doctor about any surgery you plan on having while taking this medicine since this may increase your risk for metabolic acidosis. Birth control pills may not work properly while you are taking this medicine. Talk to your doctor about using an extra method of birth control. Women who become pregnant while using this medicine may enroll in the [place] American Antiepileptic Drug Pregnancy Registry by calling [phone]. This registry collects information about the safety of antiepileptic drug use during pregnancy. What side effects may I notice from receiving this medicine? Side effects that you should report to your doctor or health care professional as soon as possible: -allergic reactions  like skin rash, itching or hives, swelling of the face, lips, or tongue -decreased sweating and/or rise in body temperature -depression -difficulty breathing, fast or irregular breathing patterns -difficulty speaking -difficulty walking or controlling muscle movements -hearing impairment -redness, blistering, peeling or loosening of the skin, including inside the mouth -tingling, pain or numbness in the hands or feet -unusually weak or tired -worsening of mood, thoughts or actions of suicide or dying Side effects that usually do not require medical attention (report to your doctor or health care professional if they continue or are bothersome): -altered taste -back pain, joint or muscle aches and pains -diarrhea, or constipation -headache -loss of appetite -nausea -stomach upset, indigestion -tremors This list may not describe all possible side effects. Call your doctor for medical advice about side effects. You may report side effects to FDA at 1-800-FDA-1088. Where should I keep my medicine? Keep out of the reach of children. Store at room temperature between 15 and 30 degrees C (59 and 86 degrees F) in a tightly closed container. Protect from moisture. Throw away any unused medicine after the expiration date. NOTE: This sheet is a summary. It may not cover all possible information. If you have questions about this medicine, talk to your doctor, pharmacist, or health care provider.  2018 Elsevier/Gold Standard (2016-02-17 12:33:11)

## 2017-11-14 NOTE — Telephone Encounter (Signed)
Received a call from [facility] due to medication refills for Sumatripan, Zofran, and steroid. I confirmed receipt of Qudexy XR order. I spoke with Dr. Lucia GaskinsAhern and gave the following verbal orders of refills/new rx to [facility]:   Refill: Zofran ODT 4 mg disintegrating tablet every 8 hours prn nausea and vomiting, 20 tablets, 11 refills.  New Rx: Medrol dose pack (instructions/dose handled by pharmacy) x 1.  Refill: Sumatriptan 100 mg tablet, 10 tablets total, 0 refill.  She verbalized receipt of orders and had no further questions.

## 2017-11-14 NOTE — Progress Notes (Signed)
[facility]    Provider:  Dr Lucia Gaskins Referring Provider: Jonna Clark* Primary Care Physician:  Jonna Clark, MD  CC:  headaches  Interval history: She tried Topiramate and made her feel weird. She went up to 50mg  and could not tolerate. Sumatriptan helps a lot. Throbbing on the right side, photophobia, nausea, movement makes it worse have to lie in a dark room. She has 15 headache days a month, 9 are migraine days, all are moderate to severe. She takes excedrin and goodys, educated on rebound headaches.  Educated on teratogenicity of Topiramate. Will try Qudexy. Also discussed other medications (TCA, B blockers etc), botox and the new CGRP medications.   HPI:  Paula Welch is a 27 y.o. female here as a referral from Dr. Yetta Barre for headaches. Patient is here with mother who provides most information. There is a family history of migraines in grandmother and aunt. Throbbing on the right side, going into a fark room helps, smells bother her, nausea and vomiting but not often. She is having them every other day. They can last a few hours. She has to go to sleep to resolve them. No known triggers. No inciting events or head trauma.  History aneurysms in mother, 2 aunts and grandmother and paternal grandmother. No vision changes or hearing changes. She takes excedrin migraine, tylenol, goody powders. Starts light and gradually gets worse can be severe. No aura. Always on the right and can be 8/10. Ringing in the ears, pulsating ringing in the ears. No other focal neurologic deficits or complaints  Reviewed notes, labs and imaging from outside physicians, which showed:  CT 5/20017 HEAD CT WITHOUT CONTRAST:  Technique: Contiguous axial images were obtained from the base of the skull through the vertex according to standard protocol without contrast.  Comparison: None.  Findings: No evidence of acute infarct, hemorrhage, mass, mass effect, hydrocephalus, or  fracture. Visualized paranasal sinuses and mastoid air cells are clear.  IMPRESSION:  No acute findings.  BMP normal with creatinine 0.6  Review of Systems: Patient complains of symptoms per HPI as well as the following symptoms: no CP, no SOB. Pertinent negatives per HPI. All others negative.    Social History   Socioeconomic History  . Marital status: Single    Spouse name: Not on file  . Number of children: 0  . Years of education: Not on file  . Highest education level: Bachelor's degree (e.g., BA, AB, BS)  Social Needs  . Financial resource strain: Not on file  . Food insecurity - worry: Not on file  . Food insecurity - inability: Not on file  . Transportation needs - medical: Not on file  . Transportation needs - non-medical: Not on file  Occupational History  . Occupation: Consulting civil engineer  . Occupation: Chief Executive Officer: [employer]  Tobacco Use  . Smoking status: Never Smoker  . Smokeless tobacco: Never Used  Substance and Sexual Activity  . Alcohol use: Yes    Alcohol/week: 1.8 oz    Types: 3 Glasses of wine per week  . Drug use: No  . Sexual activity: Not on file  Other Topics Concern  . Not on file  Social History Narrative   Caffeine- 1 cup 5 days a week   Right handed   Lives with mom    Family History  Problem Relation Age of Onset  . Aneurysm Mother   . Breast cancer Mother     Past Medical History:  Diagnosis Date  .  Headache     Past Surgical History:  Procedure Laterality Date  . no surgical history      Current Outpatient Medications  Medication Sig Dispense Refill  . acetaminophen (TYLENOL) 500 MG tablet Take 500 mg by mouth every 6 (six) hours as needed.    Marland Kitchen. aspirin-acetaminophen-caffeine (EXCEDRIN MIGRAINE) 250-250-65 MG tablet Take by mouth every 6 (six) hours as needed for headache.    . ondansetron (ZOFRAN ODT) 4 MG disintegrating tablet Take 1 tablet (4 mg total) by mouth every 8 (eight) hours as needed for nausea or  vomiting. 20 tablet 12  . SUMAtriptan (IMITREX) 100 MG tablet TAKE 1 TABLET(100 MG) BY MOUTH 1 TIME AS NEEDED FOR MIGRAINE. MAY REPEAT IN 2 HOURS IF HEADACHE PERSISTS OR RECURS 10 tablet 0  . Topiramate ER (QUDEXY XR) 100 MG CS24 sprinkle capsule Take 100 mg by mouth at bedtime. 30 each 11   No current facility-administered medications for this visit.     Allergies as of 11/14/2017  . (No Known Allergies)    Vitals: BP 116/74 (BP Location: Right Arm, Patient Position: Sitting)   Pulse 87   Ht 5\' 1"  (1.549 m)   Wt 135 lb (61.2 kg)   BMI 25.51 kg/m  Last Weight:  Wt Readings from Last 1 Encounters:  11/14/17 135 lb (61.2 kg)   Last Height:   Ht Readings from Last 1 Encounters:  11/14/17 5\' 1"  (1.549 m)    Physical exam: Exam: Gen: NAD, conversant, well nourised, well groomed                     CV: RRR, no MRG. No Carotid Bruits. No peripheral edema, warm, nontender Eyes: Conjunctivae clear without exudates or hemorrhage  Neuro: Detailed Neurologic Exam  Speech:    Speech is normal; fluent and spontaneous with normal comprehension.  Cognition:    The patient is oriented to person, place, and time;     recent and remote memory intact;     language fluent;     normal attention, concentration,     fund of knowledge Cranial Nerves:    The pupils are equal, round, and reactive to light. The fundi are normal and spontaneous venous pulsations are present. Visual fields are full to finger confrontation. Extraocular movements are intact. Trigeminal sensation is intact and the muscles of mastication are normal. The face is symmetric. The palate elevates in the midline. Hearing intact. Voice is normal. Shoulder shrug is normal. The tongue has normal motion without fasciculations.   Coordination:    Normal finger to nose and heel to shin. Normal rapid alternating movements.   Gait:    Heel-toe and tandem gait are normal.   Motor Observation:    No asymmetry, no atrophy, and no  involuntary movements noted. Tone:    Normal muscle tone.    Posture:    Posture is normal. normal erect    Strength:    Strength is V/V in the upper and lower limbs.      Sensation: intact to LT     Reflex Exam:  DTR's:    Deep tendon reflexes in the upper and lower extremities are normal bilaterally.   Toes:    The toes are downgoing bilaterally.   Clonus:    Clonus is absent.    Assessment/Plan:  27 year old with chronic migraines  Fhx of aneurysms and pulsatile tinnitus: MRA of the head was negative  As far as your medications are concerned, I  would like to suggest Topiramate ER: start with 25mg  at night for one week then increase to 50mg  at bedtime then 100mg  Imitrex: Please take one tablet at the onset of your headache. If it does not improve the symptoms please take one additional tablet in 2 hours. Do not take more then 2 tablets in 24hrs. Do not take use more then 2 to 3 days in a week. Zofran as needed for nausea  I would like to see you back in 3-4 month, sooner if we need to. Please call us with any interim questions, concerns, problems, updates or refill requests.   Discussed side effects as per patient instructions, do not get pregnant whole on topamax - teratogenic  To prevent or relieve headaches, try the following: Cool Compress. Lie down and place a cool compress on your head.  Avoid headache triggers. If certain foods or odors seem to have triggered your migraines in the past, avoid them. A headache diary might help you identify triggers.  Include physical activity in your daily routine. Try a daily walk or other moderate aerobic exercise.  Manage stress. Find healthy ways to cope with the stressors, such as delegating tasks on your to-do list.  Practice relaxation techniques. Try deep breathing, yoga, massage and visualization.  Eat regularly. Eating regularly scheduled meals and maintaining a healthy diet might help prevent headaches. Also, drink  plenty of fluids.  Follow a regular sleep schedule. Sleep deprivation might contribute to headaches Consider biofeedback. With this mind-body technique, you learn to control certain bodily functions - such as muscle tension, heart rate and blood pressure - to prevent headaches or reduce headache pain.    Proceed to emergency room if you experience new or worsening symptoms or symptoms do not resolve, if you have new neurologic symptoms or if headache is severe, or for any concerning symptom.   Provided education and documentation from American headache Society toolbox including articles on: chronic migraine medication overuse headache, chronic migraines, prevention of migraines, behavioral and other nonpharmacologic treatments for headache.   Naomie Dean, MD  [facility] [address] [place], [place] [address]  Phone [phone] Fax [phone]  A total of 25 minutes was spent in with this patient face to face. Over half this time was spent on counseling patient on the chronic migraine diagnosis and different therapeutic options available.

## 2018-01-08 NOTE — Telephone Encounter (Signed)
error 

## 2018-03-17 ENCOUNTER — Ambulatory Visit: Payer: 59 | Admitting: Neurology

## 2018-03-24 ENCOUNTER — Other Ambulatory Visit: Payer: Self-pay | Admitting: Neurology

## 2018-03-24 DIAGNOSIS — G43709 Chronic migraine without aura, not intractable, without status migrainosus: Secondary | ICD-10-CM

## 2018-04-01 ENCOUNTER — Ambulatory Visit: Payer: 59 | Admitting: Neurology

## 2018-09-16 ENCOUNTER — Other Ambulatory Visit: Payer: Self-pay | Admitting: Neurology

## 2018-09-16 ENCOUNTER — Telehealth: Payer: Self-pay | Admitting: Neurology

## 2018-09-16 DIAGNOSIS — G43709 Chronic migraine without aura, not intractable, without status migrainosus: Secondary | ICD-10-CM

## 2018-09-16 MED ORDER — SUMATRIPTAN SUCCINATE 100 MG PO TABS: ORAL_TABLET | ORAL | 0 refills | Status: DC

## 2018-09-16 NOTE — Telephone Encounter (Signed)
Sumatriptan refilled. Pt needs follow up.

## 2018-09-16 NOTE — Telephone Encounter (Signed)
Pharmacist Leamon [id number] - [place], [place] has called for refill request of SUMAtriptan (IMITREX) 100 MG tablet for pt

## 2018-09-16 NOTE — Addendum Note (Signed)
Addended by: Bertram Savin on: 09/16/2018 02:11 PM   Modules accepted: Orders

## 2019-06-12 ENCOUNTER — Other Ambulatory Visit: Payer: Self-pay | Admitting: Neurology

## 2019-06-12 DIAGNOSIS — G43709 Chronic migraine without aura, not intractable, without status migrainosus: Secondary | ICD-10-CM

## 2019-06-15 ENCOUNTER — Other Ambulatory Visit: Payer: Self-pay | Admitting: Neurology

## 2019-06-15 DIAGNOSIS — G43709 Chronic migraine without aura, not intractable, without status migrainosus: Secondary | ICD-10-CM

## 2019-08-05 ENCOUNTER — Other Ambulatory Visit: Payer: Self-pay

## 2019-08-05 DIAGNOSIS — Z20822 Contact with and (suspected) exposure to covid-19: Secondary | ICD-10-CM

## 2019-08-07 LAB — NOVEL CORONAVIRUS, NAA: SARS-CoV-2, NAA: NOT DETECTED

## 2020-08-31 ENCOUNTER — Other Ambulatory Visit: Payer: Self-pay | Admitting: Neurology

## 2020-08-31 ENCOUNTER — Encounter: Payer: Self-pay | Admitting: Family Medicine

## 2020-08-31 ENCOUNTER — Telehealth: Payer: Self-pay | Admitting: Neurology

## 2020-08-31 DIAGNOSIS — G43709 Chronic migraine without aura, not intractable, without status migrainosus: Secondary | ICD-10-CM

## 2020-08-31 NOTE — Telephone Encounter (Signed)
I called pt and scheduled her with Amy on 12/7. Her pcp moved to [place] and she is in the process of finding a new one, that's why she called here for her refill. She asked if there is anyway we can send in a refill for her before she comes in in December since she no longer has a doctor. I let her know that tomorrow a nurse can either call her or get it refilled for her. I explained that she needs to be seen before January with NP or she would need a new referral to our office since it will been 3 years since she has been seen.

## 2020-08-31 NOTE — Telephone Encounter (Signed)
Pt request refill SUMAtriptan (IMITREX) 100 MG tablet at [id number]

## 2020-08-31 NOTE — Telephone Encounter (Signed)
Unfortunately the patient hasn't been seen since January of 2019. She will need to have a refill provided by her primary care or she will have to be seen by an NP here at [facility] for more refills.

## 2020-09-01 NOTE — Telephone Encounter (Signed)
The patient called after hours last night and spoke with Dr Pearlean Brownie. He has provided her with emergency refills of Sumatriptan in the interim.

## 2020-10-17 NOTE — Patient Instructions (Incomplete)
Below is our plan:  We will ***  Please make sure you are staying well hydrated. I recommend 50-60 ounces daily. Well balanced diet and regular exercise encouraged.    Please continue follow up with care team as directed.   Follow up with *** in ***  You may receive a survey regarding today's visit. I encourage you to leave honest feed back as I do use this information to improve patient care. Thank you for seeing me today!      Migraine Headache A migraine headache is a very strong throbbing pain on one side or both sides of your head. This type of headache can also cause other symptoms. It can last from 4 hours to 3 days. Talk with your doctor about what things may bring on (trigger) this condition. What are the causes? The exact cause of this condition is not known. This condition may be triggered or caused by:  Drinking alcohol.  Smoking.  Taking medicines, such as: ? Medicine used to treat chest pain (nitroglycerin). ? Birth control pills. ? Estrogen. ? Some blood pressure medicines.  Eating or drinking certain products.  Doing physical activity. Other things that may trigger a migraine headache include:  Having a menstrual period.  Pregnancy.  Hunger.  Stress.  Not getting enough sleep or getting too much sleep.  Weather changes.  Tiredness (fatigue). What increases the risk?  Being 25-55 years old.  Being female.  Having a family history of migraine headaches.  Being Caucasian.  Having depression or anxiety.  Being very overweight. What are the signs or symptoms?  A throbbing pain. This pain may: ? Happen in any area of the head, such as on one side or both sides. ? Make it hard to do daily activities. ? Get worse with physical activity. ? Get worse around bright lights or loud noises.  Other symptoms may include: ? Feeling sick to your stomach (nauseous). ? Vomiting. ? Dizziness. ? Being sensitive to bright lights, loud noises, or  smells.  Before you get a migraine headache, you may get warning signs (an aura). An aura may include: ? Seeing flashing lights or having blind spots. ? Seeing bright spots, halos, or zigzag lines. ? Having tunnel vision or blurred vision. ? Having numbness or a tingling feeling. ? Having trouble talking. ? Having weak muscles.  Some people have symptoms after a migraine headache (postdromal phase), such as: ? Tiredness. ? Trouble thinking (concentrating). How is this treated?  Taking medicines that: ? Relieve pain. ? Relieve the feeling of being sick to your stomach. ? Prevent migraine headaches.  Treatment may also include: ? Having acupuncture. ? Avoiding foods that bring on migraine headaches. ? Learning ways to control your body functions (biofeedback). ? Therapy to help you know and deal with negative thoughts (cognitive behavioral therapy). Follow these instructions at home: Medicines  Take over-the-counter and prescription medicines only as told by your doctor.  Ask your doctor if the medicine prescribed to you: ? Requires you to avoid driving or using heavy machinery. ? Can cause trouble pooping (constipation). You may need to take these steps to prevent or treat trouble pooping:  Drink enough fluid to keep your pee (urine) pale yellow.  Take over-the-counter or prescription medicines.  Eat foods that are high in fiber. These include beans, whole grains, and fresh fruits and vegetables.  Limit foods that are high in fat and sugar. These include fried or sweet foods. Lifestyle  Do not drink alcohol.  Do   not use any products that contain nicotine or tobacco, such as cigarettes, e-cigarettes, and chewing tobacco. If you need help quitting, ask your doctor.  Get at least 8 hours of sleep every night.  Limit and deal with stress. General instructions      Keep a journal to find out what may bring on your migraine headaches. For example, write down: ? What  you eat and drink. ? How much sleep you get. ? Any change in what you eat or drink. ? Any change in your medicines.  If you have a migraine headache: ? Avoid things that make your symptoms worse, such as bright lights. ? It may help to lie down in a dark, quiet room. ? Do not drive or use heavy machinery. ? Ask your doctor what activities are safe for you.  Keep all follow-up visits as told by your doctor. This is important. Contact a doctor if:  You get a migraine headache that is different or worse than others you have had.  You have more than 15 headache days in one month. Get help right away if:  Your migraine headache gets very bad.  Your migraine headache lasts longer than 72 hours.  You have a fever.  You have a stiff neck.  You have trouble seeing.  Your muscles feel weak or like you cannot control them.  You start to lose your balance a lot.  You start to have trouble walking.  You pass out (faint).  You have a seizure. Summary  A migraine headache is a very strong throbbing pain on one side or both sides of your head. These headaches can also cause other symptoms.  This condition may be treated with medicines and changes to your lifestyle.  Keep a journal to find out what may bring on your migraine headaches.  Contact a doctor if you get a migraine headache that is different or worse than others you have had.  Contact your doctor if you have more than 15 headache days in a month. This information is not intended to replace advice given to you by your health care provider. Make sure you discuss any questions you have with your health care provider. Document Revised: 02/20/2019 Document Reviewed: 12/11/2018 Elsevier Patient Education  2020 Elsevier Inc.  

## 2020-10-17 NOTE — Progress Notes (Deleted)
No chief complaint on file.    HISTORY OF PRESENT ILLNESS: Today 10/17/20  Paula Welch is a 29 y.o. female here today for follow up for migraines. She was last seen 11/2017 and advised to take topiramate ER  at bedtime and sumatriptan 100mg  as needed. Side effects of topiramate IR intolerable.    HISTORY (copied from previous note)  Interval history: She tried Topiramate and made her feel weird. She went up to 50mg  and could not tolerate. Sumatriptan helps a lot. Throbbing on the right side, photophobia, nausea, movement makes it worse have to lie in a dark room. She has 15 headache days a month, 9 are migraine days, all are moderate to severe. She takes excedrin and goodys, educated on rebound headaches.  Educated on teratogenicity of Topiramate. Will try Qudexy. Also discussed other medications (TCA, B blockers etc), botox and the new CGRP medications.   [name] a 29 y.o.femalehere as a referral from Dr. OIZ:[id number] headaches.Patient is here with mother who provides most information.There is a family history of migraines in grandmother and aunt. Throbbing on the right side, going into a fark room helps, smells bother her, nausea and vomiting but not often. She is having them every other day. They can last a few hours. She has to go to sleepto resolve them.No known triggers. No inciting events or head trauma.History aneurysms in mother, 2 aunts and grandmother and paternal grandmother. No vision changes or hearing changes. She takes excedrin migraine, tylenol, goody powders. Starts light and gradually gets worse can be severe. No aura. Always on the right and can be 8/10. Ringing in the ears, pulsatingringing in the ears.No other focal neurologic deficits or complaints  Reviewed notes, labs and imaging from outside physicians, which showed:  CT 5/20017HEAD CT WITHOUT CONTRAST:  Technique: Contiguous axial images were obtained from the base of the skull  through the vertex according to standard protocol without contrast.  Comparison: None.  Findings: No evidence of acute infarct, hemorrhage, mass, mass effect, hydrocephalus, or fracture. Visualized paranasal sinuses and mastoid air cells are clear.  IMPRESSION:  No acute findings.  BMP normal with creatinine 0.6     REVIEW OF SYSTEMS: Out of a complete 14 system review of symptoms, the patient complains only of the following symptoms, and all other reviewed systems are negative.   ALLERGIES: No Known Allergies   HOME MEDICATIONS: Outpatient Medications Prior to Visit  Medication Sig Dispense Refill  . acetaminophen (TYLENOL) 500 MG tablet Take 500 mg by mouth every 6 (six) hours as needed.    30 aspirin-acetaminophen-caffeine (EXCEDRIN MIGRAINE) 250-250-65 MG tablet Take by mouth every 6 (six) hours as needed for headache.    . ondansetron (ZOFRAN ODT) 4 MG disintegrating tablet Take 1 tablet (4 mg total) by mouth every 8 (eight) hours as needed for nausea or vomiting. 20 tablet 12  . SUMAtriptan (IMITREX) 100 MG tablet TAKE 1 TABLET BY MOUTH AS NEEDED FOR MIGRAINE. MAY REPEAT 1 DOSE IN 2 HOURS IF NEEDED. MAX 2 TABLETS IN 24 HOURS. 10 tablet 2  . Topiramate ER (QUDEXY XR) 100 MG CS24 sprinkle capsule Take 100 mg by mouth at bedtime. 30 each 11   No facility-administered medications prior to visit.     PAST MEDICAL HISTORY: Past Medical History:  Diagnosis Date  . Headache      PAST SURGICAL HISTORY: Past Surgical History:  Procedure Laterality Date  . no surgical history       FAMILY HISTORY: Family History  Problem Relation Age of Onset  . Aneurysm Mother   . Breast cancer Mother      SOCIAL HISTORY: Social History   Socioeconomic History  . Marital status: Single    Spouse name: Not on file  . Number of children: 0  . Years of education: Not on file  . Highest education level: Bachelor's degree (e.g., BA, AB, BS)  Occupational History  . Occupation:  Consulting civil engineer  . Occupation: Chief Executive Officer: [employer]  Tobacco Use  . Smoking status: Never Smoker  . Smokeless tobacco: Never Used  Vaping Use  . Vaping Use: Never used  Substance and Sexual Activity  . Alcohol use: Yes    Alcohol/week: 3.0 standard drinks    Types: 3 Glasses of wine per week  . Drug use: No  . Sexual activity: Not on file  Other Topics Concern  . Not on file  Social History Narrative   Caffeine- 1 cup 5 days a week   Right handed   Lives with mom   Social Determinants of Health   Financial Resource Strain:   . Difficulty of Paying Living Expenses: Not on file  Food Insecurity:   . Worried About Programme researcher, broadcasting/film/video in the Last Year: Not on file  . Ran Out of Food in the Last Year: Not on file  Transportation Needs:   . Lack of Transportation (Medical): Not on file  . Lack of Transportation (Non-Medical): Not on file  Physical Activity:   . Days of Exercise per Week: Not on file  . Minutes of Exercise per Session: Not on file  Stress:   . Feeling of Stress : Not on file  Social Connections:   . Frequency of Communication with Friends and Family: Not on file  . Frequency of Social Gatherings with Friends and Family: Not on file  . Attends Religious Services: Not on file  . Active Member of Clubs or Organizations: Not on file  . Attends Banker Meetings: Not on file  . Marital Status: Not on file  Intimate Partner Violence:   . Fear of Current or Ex-Partner: Not on file  . Emotionally Abused: Not on file  . Physically Abused: Not on file  . Sexually Abused: Not on file      PHYSICAL EXAM  There were no vitals filed for this visit. There is no height or weight on file to calculate BMI.   Generalized: Well developed, in no acute distress  Cardiology: normal rate and rhythm, no murmur auscultated  Respiratory: clear to auscultation bilaterally    Neurological examination  Mentation: Alert oriented to time, place, history  taking. Follows all commands speech and language fluent Cranial nerve II-XII: Pupils were equal round reactive to light. Extraocular movements were full, visual field were full on confrontational test. Facial sensation and strength were normal. Uvula tongue midline. Head turning and shoulder shrug  were normal and symmetric. Motor: The motor testing reveals 5 over 5 strength of all 4 extremities. Good symmetric motor tone is noted throughout.  Sensory: Sensory testing is intact to soft touch on all 4 extremities. No evidence of extinction is noted.  Coordination: Cerebellar testing reveals good finger-nose-finger and heel-to-shin bilaterally.  Gait and station: Gait is normal. Tandem gait is normal. Romberg is negative. No drift is seen.  Reflexes: Deep tendon reflexes are symmetric and normal bilaterally.     DIAGNOSTIC DATA (LABS, IMAGING, TESTING) - I reviewed patient records, labs, notes, testing and imaging  myself where available.  No results found for: WBC, HGB, HCT, MCV, PLT    Component Value Date/Time   NA 140 01/10/2013 1239   K 3.6 01/10/2013 1239   CL 104 01/10/2013 1239   CO2 24 01/10/2013 1239   GLUCOSE 84 01/10/2013 1239   BUN 9 01/10/2013 1239   CREATININE 0.60 01/10/2013 1239   CALCIUM 9.9 01/10/2013 1239   GFRNONAA >90 01/10/2013 1239   GFRAA >90 01/10/2013 1239   No results found for: CHOL, HDL, LDLCALC, LDLDIRECT, TRIG, CHOLHDL No results found for: IRCV8L No results found for: VITAMINB12 No results found for: TSH    ASSESSMENT AND PLAN  29 y.o. year old female  has a past medical history of Headache. here with ***  No diagnosis found.   I spent 20 minutes of face-to-face and non-face-to-face time with patient.  This included previsit chart review, lab review, study review, order entry, electronic health record documentation, patient education.    Shawnie Dapper, MSN, FNP-C 10/17/2020, 1:36 PM  [facility] [address], [address] [place], [place] [address] [phone]

## 2020-10-18 ENCOUNTER — Ambulatory Visit (INDEPENDENT_AMBULATORY_CARE_PROVIDER_SITE_OTHER): Payer: No Typology Code available for payment source | Admitting: Adult Health

## 2020-10-18 ENCOUNTER — Other Ambulatory Visit: Payer: Self-pay

## 2020-10-18 ENCOUNTER — Encounter: Payer: Self-pay | Admitting: Adult Health

## 2020-10-18 ENCOUNTER — Ambulatory Visit: Payer: 59 | Admitting: Family Medicine

## 2020-10-18 VITALS — BP 114/81 | HR 92 | Ht 61.0 in | Wt 116.0 lb

## 2020-10-18 DIAGNOSIS — G43709 Chronic migraine without aura, not intractable, without status migrainosus: Secondary | ICD-10-CM

## 2020-10-18 MED ORDER — SUMATRIPTAN SUCCINATE 100 MG PO TABS: ORAL_TABLET | ORAL | 11 refills | Status: DC

## 2020-10-18 MED ORDER — ONDANSETRON 4 MG PO TBDP: 4.0000 mg | ORAL_TABLET | Freq: Three times a day (TID) | ORAL | 12 refills | Status: DC | PRN

## 2020-10-18 NOTE — Progress Notes (Addendum)
[facility]    Provider:  Dr Paula Welch Referring Provider: Jonna Welch* Primary Care Physician:  Paula Clark, MD  CC:   Migraines   HPI: Today, 10/18/2020, [name] is a very pleasant 29 year old female who returns for migraine follow-up.  Prior visit 11/14/2017 with Dr. Lucia Welch with recommended 68-month follow-up but has not had follow-up until today.  Reports running out of Qudexy approx 2-3 months ago but has not experienced any worsening migraine headaches.  Does report possible side effects such as feeling in a daze and difficulty remembering while on Qudexy.  Currently, experiences max 1 migraine per month typically lasting around 3 days. Takes sumatriptan with benefit.  No concerns at this time.   History provided for reference purposes only Interval history 11/14/2017 Dr. Lucia Welch: She tried Topiramate and made her feel weird. She went up to 50mg  and could not tolerate. Sumatriptan helps a lot. Throbbing on the right side, photophobia, nausea, movement makes it worse have to lie in a dark room. She has 15 headache days a month, 9 are migraine days, all are moderate to severe. She takes excedrin and goodys, educated on rebound headaches.  Educated on teratogenicity of Topiramate. Will try Qudexy. Also discussed other medications (TCA, B blockers etc), botox and the new CGRP medications.   HPI:  Paula Welch is a 29 y.o. female here as a referral from Dr. 30 for headaches. Patient is here with mother who provides most information. There is a family history of migraines in grandmother and aunt. Throbbing on the right side, going into a fark room helps, smells bother her, nausea and vomiting but not often. She is having them every other day. They can last a few hours. She has to go to sleep to resolve them. No known triggers. No inciting events or head trauma.  History aneurysms in mother, 2 aunts and grandmother and paternal grandmother. No vision changes  or hearing changes. She takes excedrin migraine, tylenol, goody powders. Starts light and gradually gets worse can be severe. No aura. Always on the right and can be 8/10. Ringing in the ears, pulsating ringing in the ears. No other focal neurologic deficits or complaints  Reviewed notes, labs and imaging from outside physicians, which showed:  CT 5/20017 HEAD CT WITHOUT CONTRAST:  Technique: Contiguous axial images were obtained from the base of the skull through the vertex according to standard protocol without contrast.  Comparison: None.  Findings: No evidence of acute infarct, hemorrhage, mass, mass effect, hydrocephalus, or fracture. Visualized paranasal sinuses and mastoid air cells are clear.  IMPRESSION:  No acute findings.  BMP normal with creatinine 0.6  Review of Systems: Patient complains of symptoms per HPI. Pertinent negatives per HPI. All others negative.    Social History   Socioeconomic History  . Marital status: Single    Spouse name: Not on file  . Number of children: 0  . Years of education: Not on file  . Highest education level: Bachelor's degree (e.g., BA, AB, BS)  Occupational History  . Occupation: 06-27-1970  . 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 Smoking status: Never Smoker  . Smokeless tobacco: Never Used  Vaping Use  . Vaping Use: Never used  Substance and Sexual Activity  . Alcohol use: Yes    Alcohol/week: 3.0 standard drinks    Types: 3 Glasses of wine per week  . Drug use: No  . Sexual activity: Not on file  Other Topics Concern  .  Not on file  Social History Narrative   Caffeine- 1 cup 5 days a week   Right handed   Lives with mom   Social Determinants of Health   Financial Resource Strain:   . Difficulty of Paying Living Expenses: Not on file  Food Insecurity:   . Worried About Programme researcher, broadcasting/film/video in the Last Year: Not on file  . Ran Out of Food in the Last Year: Not on file  Transportation  Needs:   . Lack of Transportation (Medical): Not on file  . Lack of Transportation (Non-Medical): Not on file  Physical Activity:   . Days of Exercise per Week: Not on file  . Minutes of Exercise per Session: Not on file  Stress:   . Feeling of Stress : Not on file  Social Connections:   . Frequency of Communication with Friends and Family: Not on file  . Frequency of Social Gatherings with Friends and Family: Not on file  . Attends Religious Services: Not on file  . Active Member of Clubs or Organizations: Not on file  . Attends Banker Meetings: Not on file  . Marital Status: Not on file  Intimate Partner Violence:   . Fear of Current or Ex-Partner: Not on file  . Emotionally Abused: Not on file  . Physically Abused: Not on file  . Sexually Abused: Not on file    Family History  Problem Relation Age of Onset  . Aneurysm Mother   . Breast cancer Mother     Past Medical History:  Diagnosis Date  . Headache     Past Surgical History:  Procedure Laterality Date  . no surgical history      Current Outpatient Medications  Medication Sig Dispense Refill  . aspirin-acetaminophen-caffeine (EXCEDRIN MIGRAINE) 250-250-65 MG tablet Take by mouth every 6 (six) hours as needed for headache.    . ondansetron (ZOFRAN ODT) 4 MG disintegrating tablet Take 1 tablet (4 mg total) by mouth every 8 (eight) hours as needed for nausea or vomiting. 20 tablet 12  . SUMAtriptan (IMITREX) 100 MG tablet TAKE 1 TABLET BY MOUTH AS NEEDED FOR MIGRAINE. MAY REPEAT 1 DOSE IN 2 HOURS IF NEEDED. MAX 2 TABLETS IN 24 HOURS. 10 tablet 2   No current facility-administered medications for this visit.    Allergies as of 10/18/2020  . (No Known Allergies)    Vitals: BP 114/81   Pulse 92   Ht 5\' 1"  (1.549 m)   Wt 116 lb (52.6 kg)   BMI 21.92 kg/m  Last Weight:  Wt Readings from Last 1 Encounters:  10/18/20 116 lb (52.6 kg)   Last Height:   Ht Readings from Last 1 Encounters:   10/18/20 5\' 1"  (1.549 m)    Physical exam: Exam: Gen: NAD, conversant, well nourised, well groomed very pleasant young female                     CV: RRR, no MRG. No Carotid Bruits. No peripheral edema, warm, nontender Eyes: Conjunctivae clear without exudates or hemorrhage  Neuro: Detailed Neurologic Exam  Speech:    Speech is normal; fluent and spontaneous with normal comprehension.  Cognition:    The patient is oriented to person, place, and time;     recent and remote memory intact;     language fluent;     normal attention, concentration,     fund of knowledge Cranial Nerves:    The pupils are equal, round,  and reactive to light. The fundi are normal and spontaneous venous pulsations are present. Visual fields are full to finger confrontation. Extraocular movements are intact. Trigeminal sensation is intact and the muscles of mastication are normal. The face is symmetric. The palate elevates in the midline. Hearing intact. Voice is normal. Shoulder shrug is normal. The tongue has normal motion without fasciculations.   Coordination:    Normal finger to nose and heel to shin. Normal rapid alternating movements.   Gait:    Heel-toe and tandem gait are normal.   Motor Observation:    No asymmetry, no atrophy, and no involuntary movements noted. Tone:    Normal muscle tone.    Posture:    Posture is normal. normal erect    Strength:    Strength is V/V in the upper and lower limbs.      Sensation: intact to LT     Reflex Exam:  DTR's:    Deep tendon reflexes in the upper and lower extremities are normal bilaterally.   Toes:    The toes are downgoing bilaterally.   Clonus:    Clonus is absent.    Assessment/Plan:  29 year old with chronic migraines.  Currently experiencing 3 migraine days per month without use of preventative prophylaxis and benefit with sumatriptan Fhx of aneurysms and pulsatile tinnitus: MRA of the head was negative  Preventative treatment:  -Previously on Qudexy stopped 2-3 months ago due to running out of rx without increase in migraine headaches -No indication at this time to restart. Will continue to monitor -if preventative treatment indicated in the future, may need to consider alternate medication reporting memory difficulties and feeling in a daze  Abortive treatment: -Continue sumatriptan 100 mg at migraine onset. May repeat x1 >2hrs if needed. Max dose 200mg /24hrs and no more than 2 to 3 days/week -Continue Zofran as needed for nausea associated with migraine   Follow-up in 1 year with Amy, NP or call earlier if needed   CC:  [facility] provider: Dr. , Desmond Lope, MD   I spent 20 minutes of face-to-face and non-face-to-face time with patient.  This included previsit chart review, lab review, study review, order entry, electronic health record documentation, patient education and discussion regarding chronic migraines, continue treatment options and possible further treatment options if indicated and answered all questions to patient satisfaction  Jori Moll, [facility]  [facility] [address] [place], [place]  Phone [phone] Fax [phone] Note: This document was prepared with digital dictation and possible smart phrase technology. Any transcriptional errors that result from this process are unintentional.  Made any corrections needed, and agree with history, physical, neuro exam,assessment and plan as stated.     [phone], MD [facility]

## 2020-11-29 ENCOUNTER — Ambulatory Visit: Payer: 59 | Admitting: Neurology

## 2021-10-23 ENCOUNTER — Telehealth: Payer: No Typology Code available for payment source | Admitting: Adult Health

## 2021-10-23 ENCOUNTER — Encounter: Payer: Self-pay | Admitting: Family Medicine

## 2021-10-23 ENCOUNTER — Telehealth (INDEPENDENT_AMBULATORY_CARE_PROVIDER_SITE_OTHER): Payer: 59 | Admitting: Family Medicine

## 2021-10-23 DIAGNOSIS — G43709 Chronic migraine without aura, not intractable, without status migrainosus: Secondary | ICD-10-CM | POA: Diagnosis not present

## 2021-10-23 MED ORDER — ONDANSETRON 4 MG PO TBDP: 4.0000 mg | ORAL_TABLET | Freq: Three times a day (TID) | ORAL | 12 refills | Status: AC | PRN

## 2021-10-23 MED ORDER — SUMATRIPTAN SUCCINATE 100 MG PO TABS: ORAL_TABLET | ORAL | 11 refills | Status: AC

## 2021-10-23 NOTE — Patient Instructions (Signed)
Below is our plan:  We will continue sumatriptan as needed for abortive therapy. Please take 1 tablet at onset of headache. May take 1 additional tablet in 2 hours if needed. Do not take more than 2 tablets in 24 hours or more than 10 in a month.   Please make sure you are staying well hydrated. I recommend 50-60 ounces daily. Well balanced diet and regular exercise encouraged. Consistent sleep schedule with 6-8 hours recommended.   Please continue follow up with care team as directed.   Follow up with me in 1 year  You may receive a survey regarding today's visit. I encourage you to leave honest feed back as I do use this information to improve patient care. Thank you for seeing me today!

## 2021-10-23 NOTE — Progress Notes (Signed)
PATIENT: Paula Welch DOB: July 31, 1991  REASON FOR VISIT: follow up HISTORY FROM: patient  Virtual Visit via Telephone Note  I connected with Paula Welch on 10/23/21 at 11:00 AM EST by telephone and verified that I am speaking with the correct person using two identifiers.   I discussed the limitations, risks, security and privacy concerns of performing an evaluation and management service by telephone and the availability of in person appointments. I also discussed with the patient that there may be a patient responsible charge related to this service. The patient expressed understanding and agreed to proceed.   History of Present Illness:  10/23/21 ALL: Alila Sotero is a 30 y.o. female here today for follow up for migraines. She continues sumatriptan and ondansetron for abortive therapy. She may have 3-4 migraines per month. She is tolerating meds and they work well for abortive therapy. She may have to repeat sumatriptan once a month. She is feeling well and without concerns, today.    History (copied from Shanda Bumps McCue's previous note)  Today, 10/18/2020, Ms. Tibbits is a very pleasant 30 year old female who returns for migraine follow-up.  Prior visit 11/14/2017 with Dr. Lucia Gaskins with recommended 75-month follow-up but has not had follow-up until today. Reports running out of Qudexy approx 2-3 months ago but has not experienced any worsening migraine headaches.  Does report possible side effects such as feeling in a daze and difficulty remembering while on Qudexy.  Currently, experiences max 1 migraine per month typically lasting around 3 days. Takes sumatriptan with benefit.  No concerns at this time.   Observations/Objective:  Generalized: Well developed, in no acute distress  Mentation: Alert oriented to time, place, history taking. Follows all commands speech and language fluent   Assessment and Plan:  30 y.o. year old female  has a past medical history of Headache. here  with    ICD-10-CM   1. Chronic migraine without aura without status migrainosus, not intractable  G43.709 SUMAtriptan (IMITREX) 100 MG tablet    ondansetron (ZOFRAN ODT) 4 MG disintegrating tablet      Ms. Rommel is doing well. We will continue ondansetron and sumatriptan for abortive therapy. Healthy lifestyle habits encouraged. She will follow up with me in 1 year, sooner if needed.   No orders of the defined types were placed in this encounter.   Meds ordered this encounter  Medications   SUMAtriptan (IMITREX) 100 MG tablet    Sig: TAKE 1 TABLET BY MOUTH AS NEEDED FOR MIGRAINE. MAY REPEAT 1 DOSE IN 2 HOURS IF NEEDED. MAX 2 TABLETS IN 24 HOURS.    Dispense:  10 tablet    Refill:  11    Order Specific Question:   Supervising Provider    Answer:   Anson Fret [[id number]]   ondansetron (ZOFRAN ODT) 4 MG disintegrating tablet    Sig: Take 1 tablet (4 mg total) by mouth every 8 (eight) hours as needed for nausea or vomiting.    Dispense:  20 tablet    Refill:  12    Order Specific Question:   Supervising Provider    Answer:   Anson Fret [[id number]]     Follow Up Instructions:  I discussed the assessment and treatment plan with the patient. The patient was provided an opportunity to ask questions and all were answered. The patient agreed with the plan and demonstrated an understanding of the instructions.   The patient was advised to call back or seek an in-person evaluation if the symptoms  worsen or if the condition fails to improve as anticipated.  I provided 15 minutes of non-face-to-face time during this encounter. Patient located at their place of residence during Mychart visit. Provider is in the office.    Shawnie Dapper, NP
